# Patient Record
Sex: Male | Born: 1995 | Race: White | Hispanic: No | Marital: Single | Smoking: Never smoker
Health system: Southern US, Community
[De-identification: ages and names within clinical notes are randomized; demographics above are authoritative.]

---
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
## 2013-06-06 IMAGING — CR DG THORACIC SPINE 2-3V
1 series · 3 of 3 positions shown · non-contrast
Comparison: none

REASON FOR EXAM: back pain
COMMENTS:

[Series 1: t thoracic spine ap · 0.14mm/px · 3 of 3 slices shown]
[im 1/3]
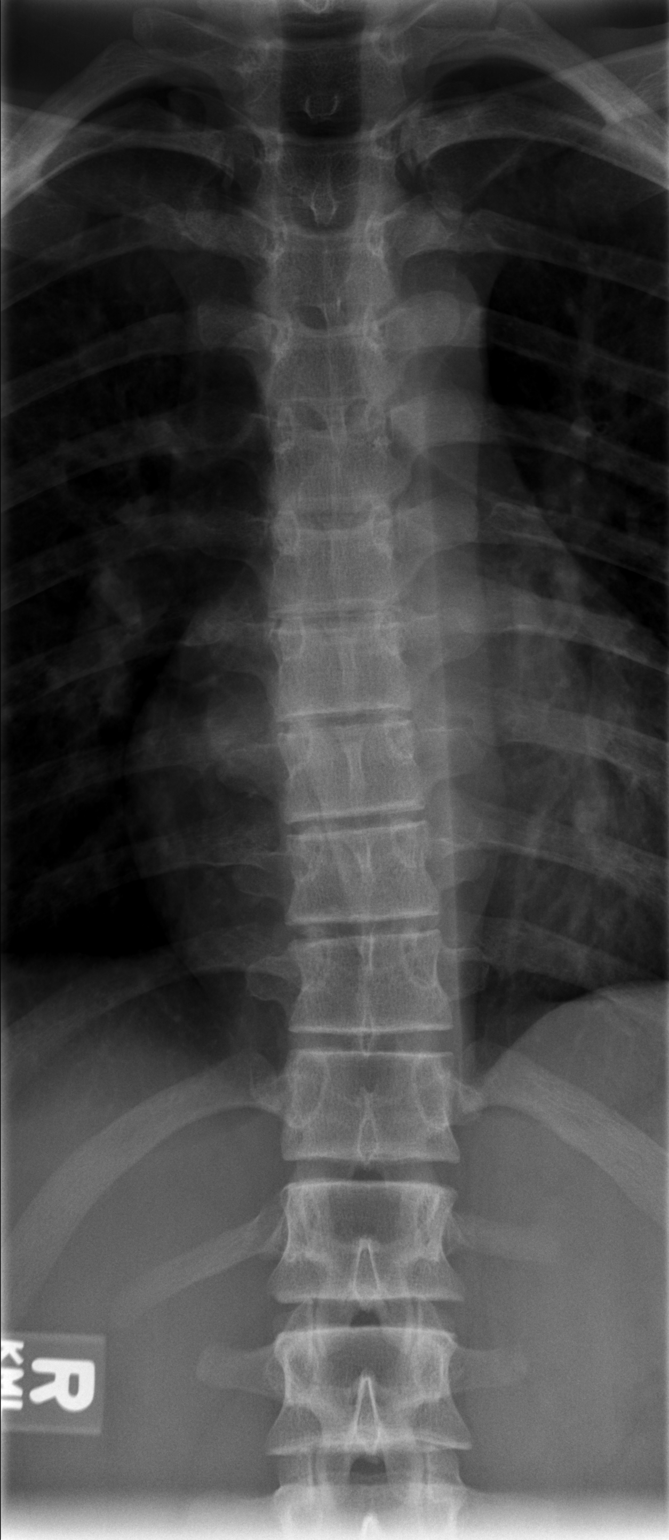
[im 2/3]
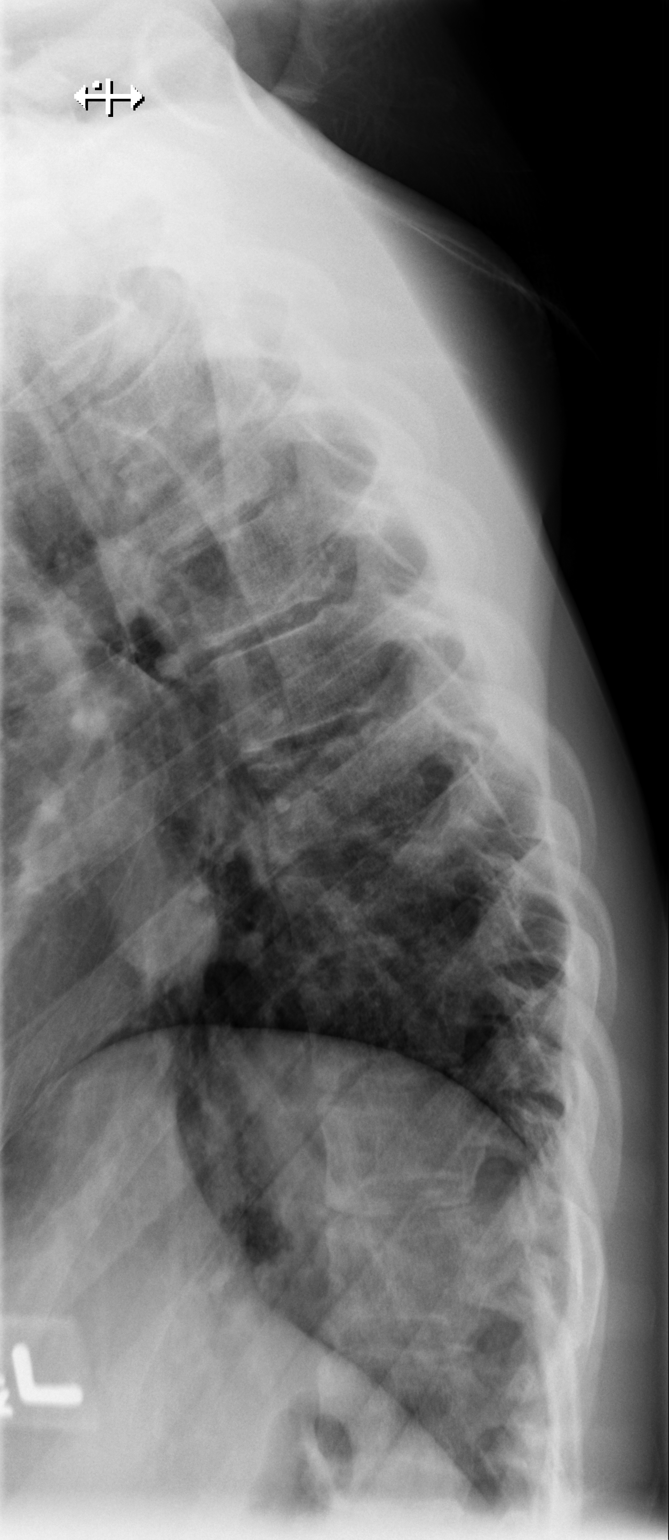
[im 3/3]
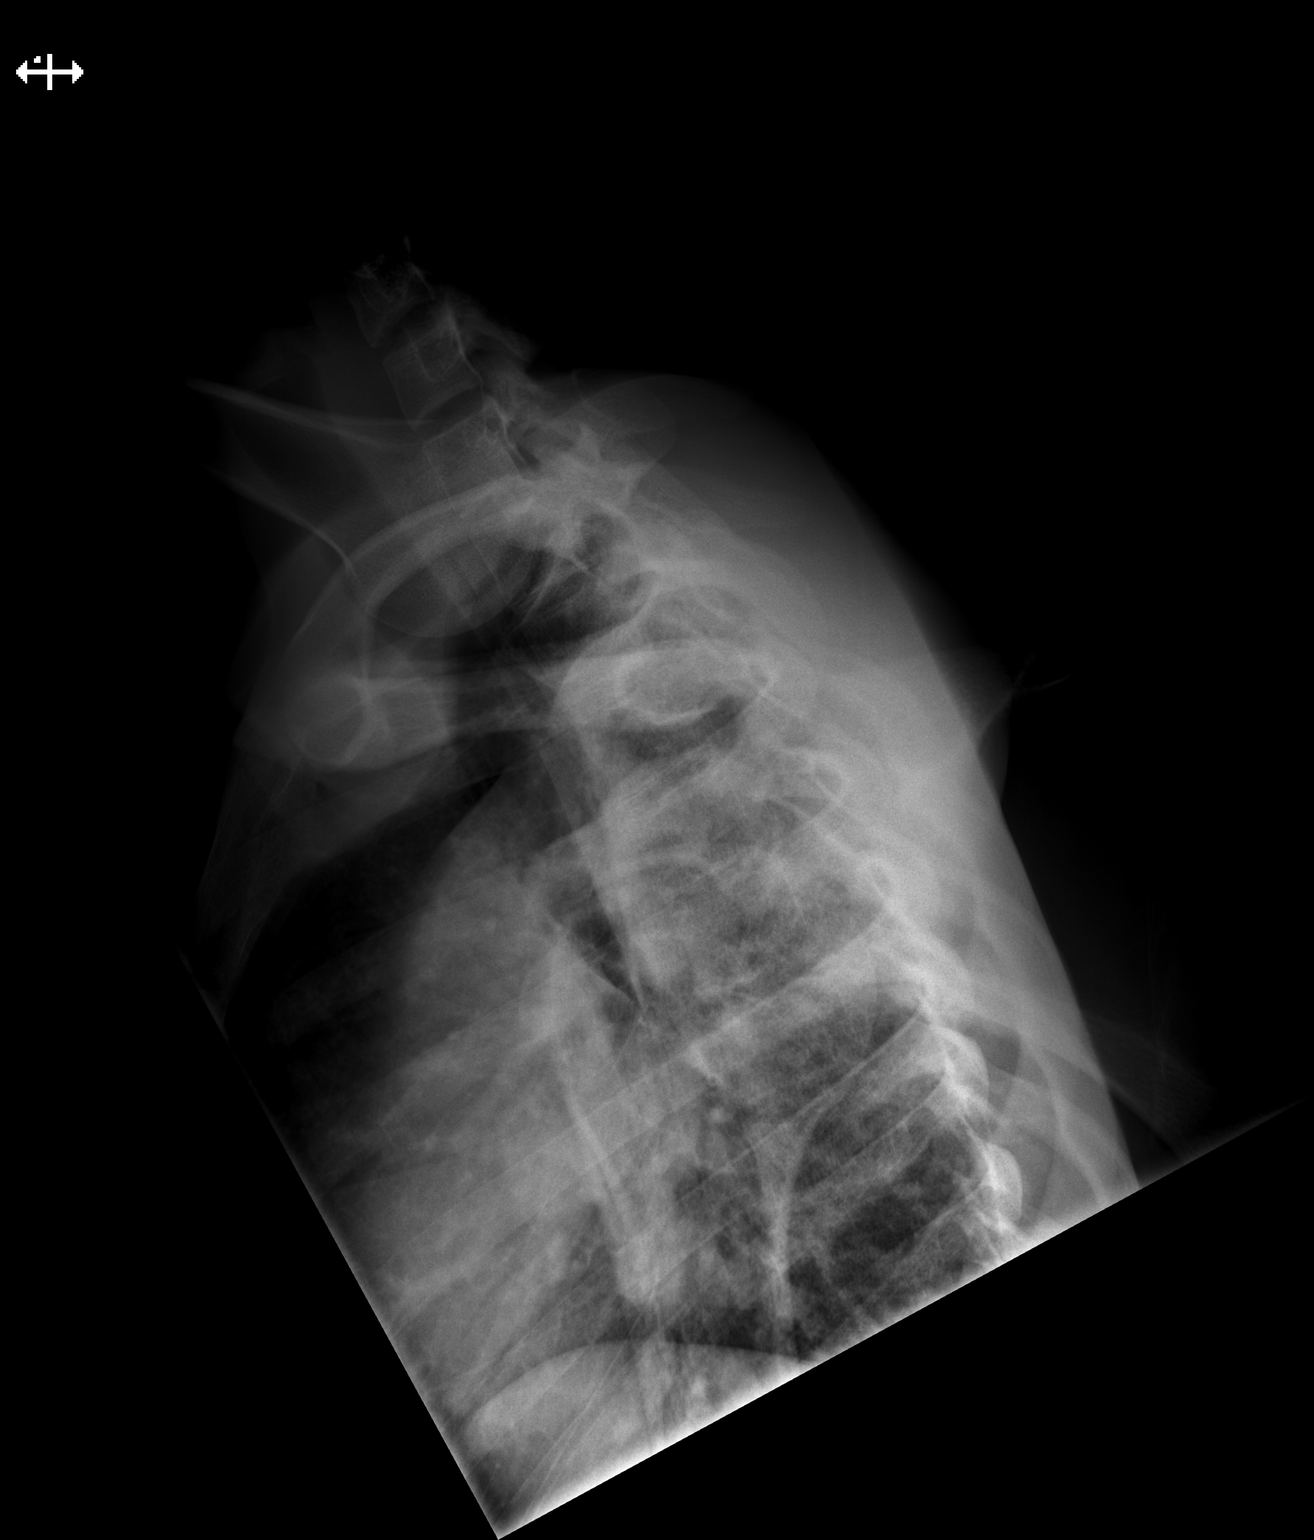

[3 of 3 positions shown; findings below may reference images not displayed]

PROCEDURE:     DXR - DXR THORACIC  AP AND LATERAL  - May 19, 2011  [DATE]

RESULT:     There is a minimal lower thoracic scoliosis concave to the right
centered at the T11 level. No congenital vertebral abnormality is present.
There is no compression deformity or subluxation. No bony destruction is
evident.
IMPRESSION: Minimal scoliotic curvature which could be secondary to
positioning no acute thoracic spine bony abnormality evident.

## 2015-09-01 DIAGNOSIS — M542 Cervicalgia: Secondary | ICD-10-CM | POA: Diagnosis not present

## 2015-09-08 DIAGNOSIS — M542 Cervicalgia: Secondary | ICD-10-CM | POA: Diagnosis not present

## 2015-09-22 DIAGNOSIS — M542 Cervicalgia: Secondary | ICD-10-CM | POA: Diagnosis not present

## 2015-10-04 DIAGNOSIS — A09 Infectious gastroenteritis and colitis, unspecified: Secondary | ICD-10-CM | POA: Diagnosis not present

## 2015-10-14 DIAGNOSIS — S52509A Unspecified fracture of the lower end of unspecified radius, initial encounter for closed fracture: Secondary | ICD-10-CM | POA: Diagnosis not present

## 2015-10-14 DIAGNOSIS — W208XXA Other cause of strike by thrown, projected or falling object, initial encounter: Secondary | ICD-10-CM | POA: Diagnosis not present

## 2015-10-14 DIAGNOSIS — S52502A Unspecified fracture of the lower end of left radius, initial encounter for closed fracture: Secondary | ICD-10-CM | POA: Diagnosis not present

## 2015-10-15 DIAGNOSIS — S52532A Colles' fracture of left radius, initial encounter for closed fracture: Secondary | ICD-10-CM | POA: Diagnosis not present

## 2015-10-15 DIAGNOSIS — M25532 Pain in left wrist: Secondary | ICD-10-CM | POA: Diagnosis not present

## 2015-10-28 DIAGNOSIS — S52532A Colles' fracture of left radius, initial encounter for closed fracture: Secondary | ICD-10-CM | POA: Diagnosis not present

## 2015-10-28 DIAGNOSIS — M25532 Pain in left wrist: Secondary | ICD-10-CM | POA: Diagnosis not present

## 2015-11-12 DIAGNOSIS — S52532D Colles' fracture of left radius, subsequent encounter for closed fracture with routine healing: Secondary | ICD-10-CM | POA: Diagnosis not present

## 2015-11-25 DIAGNOSIS — S52532D Colles' fracture of left radius, subsequent encounter for closed fracture with routine healing: Secondary | ICD-10-CM | POA: Diagnosis not present

## 2019-03-13 DIAGNOSIS — J019 Acute sinusitis, unspecified: Secondary | ICD-10-CM | POA: Diagnosis not present

## 2019-03-13 DIAGNOSIS — Z03818 Encounter for observation for suspected exposure to other biological agents ruled out: Secondary | ICD-10-CM | POA: Diagnosis not present

## 2019-03-13 DIAGNOSIS — H66002 Acute suppurative otitis media without spontaneous rupture of ear drum, left ear: Secondary | ICD-10-CM | POA: Diagnosis not present

## 2019-03-13 DIAGNOSIS — Z209 Contact with and (suspected) exposure to unspecified communicable disease: Secondary | ICD-10-CM | POA: Diagnosis not present

## 2019-07-22 DIAGNOSIS — Z209 Contact with and (suspected) exposure to unspecified communicable disease: Secondary | ICD-10-CM | POA: Diagnosis not present

## 2019-07-22 DIAGNOSIS — H66002 Acute suppurative otitis media without spontaneous rupture of ear drum, left ear: Secondary | ICD-10-CM | POA: Diagnosis not present

## 2019-07-22 DIAGNOSIS — Z03818 Encounter for observation for suspected exposure to other biological agents ruled out: Secondary | ICD-10-CM | POA: Diagnosis not present

## 2019-07-22 DIAGNOSIS — J019 Acute sinusitis, unspecified: Secondary | ICD-10-CM | POA: Diagnosis not present

## 2019-12-19 NOTE — Progress Notes (Signed)
Scheduled to complete physical 12/26/19 with Ron Smith, PA-C.  AMD 

## 2019-12-20 ENCOUNTER — Ambulatory Visit: Payer: Self-pay

## 2019-12-20 ENCOUNTER — Other Ambulatory Visit: Payer: Self-pay

## 2019-12-20 DIAGNOSIS — Z0000 Encounter for general adult medical examination without abnormal findings: Secondary | ICD-10-CM

## 2019-12-20 LAB — POCT URINALYSIS DIPSTICK
Bilirubin, UA: NEGATIVE
Blood, UA: NEGATIVE
Glucose, UA: NEGATIVE
Ketones, UA: NEGATIVE
Leukocytes, UA: NEGATIVE
Nitrite, UA: NEGATIVE
Protein, UA: NEGATIVE
Spec Grav, UA: 1.025 (ref 1.010–1.025)
Urobilinogen, UA: 0.2 E.U./dL
pH, UA: 6 (ref 5.0–8.0)

## 2019-12-21 LAB — CMP12+LP+TP+TSH+6AC+CBC/D/PLT
ALT: 20 IU/L (ref 0–44)
AST: 17 IU/L (ref 0–40)
Albumin/Globulin Ratio: 2.4 — ABNORMAL HIGH (ref 1.2–2.2)
Albumin: 5 g/dL (ref 4.1–5.2)
Alkaline Phosphatase: 66 IU/L (ref 48–121)
BUN/Creatinine Ratio: 17 (ref 9–20)
BUN: 12 mg/dL (ref 6–20)
Basophils Absolute: 0.1 10*3/uL (ref 0.0–0.2)
Basos: 1 %
Bilirubin Total: 0.7 mg/dL (ref 0.0–1.2)
Calcium: 9.8 mg/dL (ref 8.7–10.2)
Chloride: 101 mmol/L (ref 96–106)
Chol/HDL Ratio: 3.7 ratio (ref 0.0–5.0)
Cholesterol, Total: 158 mg/dL (ref 100–199)
Creatinine, Ser: 0.72 mg/dL — ABNORMAL LOW (ref 0.76–1.27)
EOS (ABSOLUTE): 0.1 10*3/uL (ref 0.0–0.4)
Eos: 2 %
Estimated CHD Risk: 0.6 times avg. (ref 0.0–1.0)
Free Thyroxine Index: 2.2 (ref 1.2–4.9)
GFR calc Af Amer: 150 mL/min/{1.73_m2} (ref >59)
GFR calc non Af Amer: 130 mL/min/{1.73_m2} (ref >59)
GGT: 17 IU/L (ref 0–65)
Globulin, Total: 2.1 g/dL (ref 1.5–4.5)
Glucose: 93 mg/dL (ref 65–99)
HDL: 43 mg/dL (ref >39)
Hematocrit: 44.2 % (ref 37.5–51.0)
Hemoglobin: 15.1 g/dL (ref 13.0–17.7)
Immature Grans (Abs): 0 10*3/uL (ref 0.0–0.1)
Immature Granulocytes: 0 %
Iron: 90 ug/dL (ref 38–169)
LDH: 152 IU/L (ref 121–224)
LDL Chol Calc (NIH): 101 mg/dL — ABNORMAL HIGH (ref 0–99)
Lymphocytes Absolute: 2.1 10*3/uL (ref 0.7–3.1)
Lymphs: 40 %
MCH: 30.3 pg (ref 26.6–33.0)
MCHC: 34.2 g/dL (ref 31.5–35.7)
MCV: 89 fL (ref 79–97)
Monocytes Absolute: 0.3 10*3/uL (ref 0.1–0.9)
Monocytes: 5 %
Neutrophils Absolute: 2.8 10*3/uL (ref 1.4–7.0)
Neutrophils: 52 %
Phosphorus: 3.7 mg/dL (ref 2.8–4.1)
Platelets: 269 10*3/uL (ref 150–450)
Potassium: 4.1 mmol/L (ref 3.5–5.2)
RBC: 4.98 x10E6/uL (ref 4.14–5.80)
RDW: 12.5 % (ref 11.6–15.4)
Sodium: 137 mmol/L (ref 134–144)
T3 Uptake Ratio: 28 % (ref 24–39)
T4, Total: 7.7 ug/dL (ref 4.5–12.0)
TSH: 1.36 u[IU]/mL (ref 0.450–4.500)
Total Protein: 7.1 g/dL (ref 6.0–8.5)
Triglycerides: 74 mg/dL (ref 0–149)
Uric Acid: 5.3 mg/dL (ref 3.8–8.4)
VLDL Cholesterol Cal: 14 mg/dL (ref 5–40)
WBC: 5.4 10*3/uL (ref 3.4–10.8)

## 2019-12-26 ENCOUNTER — Ambulatory Visit: Payer: Self-pay | Admitting: Physician Assistant

## 2019-12-26 ENCOUNTER — Encounter: Payer: Self-pay | Admitting: Physician Assistant

## 2019-12-26 ENCOUNTER — Other Ambulatory Visit: Payer: Self-pay

## 2019-12-26 VITALS — BP 114/72 | HR 82 | Temp 98.9°F | Resp 14 | Ht 71.0 in | Wt 190.0 lb

## 2019-12-26 DIAGNOSIS — Z0000 Encounter for general adult medical examination without abnormal findings: Secondary | ICD-10-CM

## 2019-12-26 NOTE — Progress Notes (Signed)
° °  Subjective: Physical exam    Patient ID: Troy Kelly, male    DOB: 10-Apr-1996, 24 y.o.   MRN: [id number]  HPI Patient presents for physical exam.  Patient voices no concerns or complaints.   Review of Systems Negative    Objective:   Physical Exam HEENT is unremarkable.  Neck is supple without adenopathy or bruits.  Lungs are clear to auscultation.  Heart regular rate and rhythm.  Abdomen with negative HSM, normoactive bowel sounds, soft, nontender palpation.  No obvious deformity to the upper or lower extremities.  Patient is full neck range of motion upper and lower extremities.  No obvious deformity to the cervical or lumbar spine.  Patient has full neck range of motion cervical lumbar spine.  Cranial nerves II through XII grossly intact.       Assessment & Plan: Well exam  Discussed no acute findings lab results.  Patient vies follow-up as needed.
